# Patient Record
Sex: Male | Born: 1985 | Hispanic: No | Smoking: Never smoker
Health system: Southern US, Community
[De-identification: ages and names within clinical notes are randomized; demographics above are authoritative.]

---

## 2014-10-05 ENCOUNTER — Encounter: Payer: Self-pay | Admitting: Emergency Medicine

## 2014-10-05 ENCOUNTER — Emergency Department
Admission: EM | Admit: 2014-10-05 | Discharge: 2014-10-06 | Disposition: A | Discharge status: Home / Self Care | Payer: Medicaid - Out of State | Attending: Emergency Medicine | Admitting: Emergency Medicine

## 2014-10-05 DIAGNOSIS — R109 Unspecified abdominal pain: Secondary | ICD-10-CM | POA: Diagnosis present

## 2014-10-05 DIAGNOSIS — B349 Viral infection, unspecified: Secondary | ICD-10-CM | POA: Diagnosis not present

## 2014-10-05 MED ORDER — SODIUM CHLORIDE 0.9 % IV BOLUS (SEPSIS)
1000.0000 mL | Freq: Once | INTRAVENOUS | Status: AC
  Administered 2014-10-06: 1000 mL via INTRAVENOUS

## 2014-10-05 NOTE — ED Notes (Signed)
Pt to triage via w/c with no distress noted; pt c/o HA to top of head, chills, body aches since last night, mid abd pain

## 2014-10-06 ENCOUNTER — Emergency Department: Payer: Medicaid - Out of State

## 2014-10-06 LAB — URINALYSIS COMPLETE WITH MICROSCOPIC (ARMC ONLY)
BACTERIA UA: NONE SEEN
BILIRUBIN URINE: NEGATIVE
Glucose, UA: NEGATIVE mg/dL
HGB URINE DIPSTICK: NEGATIVE
Leukocytes, UA: NEGATIVE
Nitrite: NEGATIVE
PH: 6 (ref 5.0–8.0)
PROTEIN: NEGATIVE mg/dL
Specific Gravity, Urine: 1.021 (ref 1.005–1.030)

## 2014-10-06 LAB — COMPREHENSIVE METABOLIC PANEL
ALT: 27 U/L (ref 17–63)
AST: 24 U/L (ref 15–41)
Albumin: 4.7 g/dL (ref 3.5–5.0)
Alkaline Phosphatase: 58 U/L (ref 38–126)
Anion gap: 9 (ref 5–15)
BUN: 11 mg/dL (ref 6–20)
CO2: 27 mmol/L (ref 22–32)
CREATININE: 0.96 mg/dL (ref 0.61–1.24)
Calcium: 9.4 mg/dL (ref 8.9–10.3)
Chloride: 104 mmol/L (ref 101–111)
GFR calc non Af Amer: >60 mL/min (ref >60)
GLUCOSE: 113 mg/dL — AB (ref 65–99)
POTASSIUM: 3.7 mmol/L (ref 3.5–5.1)
Sodium: 140 mmol/L (ref 135–145)
Total Bilirubin: 0.6 mg/dL (ref 0.3–1.2)
Total Protein: 7.7 g/dL (ref 6.5–8.1)

## 2014-10-06 LAB — CBC
HCT: 45.3 % (ref 40.0–52.0)
HEMOGLOBIN: 15.2 g/dL (ref 13.0–18.0)
MCH: 29.2 pg (ref 26.0–34.0)
MCHC: 33.6 g/dL (ref 32.0–36.0)
MCV: 86.9 fL (ref 80.0–100.0)
Platelets: 152 10*3/uL (ref 150–440)
RBC: 5.21 MIL/uL (ref 4.40–5.90)
RDW: 13.7 % (ref 11.5–14.5)
WBC: 17.2 10*3/uL — ABNORMAL HIGH (ref 3.8–10.6)

## 2014-10-06 LAB — MONONUCLEOSIS SCREEN: Mono Screen: NEGATIVE

## 2014-10-06 LAB — POCT RAPID STREP A: Streptococcus, Group A Screen (Direct): NEGATIVE

## 2014-10-06 MED ORDER — ONDANSETRON HCL 4 MG/2ML IJ SOLN
4.0000 mg | Freq: Once | INTRAMUSCULAR | Status: AC
  Administered 2014-10-06: 4 mg via INTRAVENOUS
  Filled 2014-10-06: qty 2

## 2014-10-06 MED ORDER — IOHEXOL 300 MG/ML  SOLN
100.0000 mL | Freq: Once | INTRAMUSCULAR | Status: AC | PRN
  Administered 2014-10-06: 100 mL via INTRAVENOUS

## 2014-10-06 MED ORDER — MORPHINE SULFATE 2 MG/ML IJ SOLN
2.0000 mg | Freq: Once | INTRAMUSCULAR | Status: AC
  Administered 2014-10-06: 2 mg via INTRAVENOUS
  Filled 2014-10-06: qty 1

## 2014-10-06 MED ORDER — IOHEXOL 240 MG/ML SOLN
25.0000 mL | Freq: Once | INTRAMUSCULAR | Status: AC | PRN
  Administered 2014-10-06: 25 mL via ORAL

## 2014-10-06 NOTE — ED Notes (Signed)
POC Group A Strep result= NEGATIVE

## 2014-10-06 NOTE — ED Notes (Signed)
Patient transported to CT 

## 2014-10-06 NOTE — ED Notes (Signed)
Pt returned from CT °

## 2014-10-06 NOTE — Discharge Instructions (Signed)

## 2014-10-06 NOTE — ED Provider Notes (Signed)
[facility] Emergency Department Provider Note  ____________________________________________  Time seen: 12:30 AM  I have reviewed the triage vital signs and the nursing notes.   HISTORY  Chief Complaint Fever; Headache; and Abdominal Pain      HPI Nicholas Wells is a 29 y.o. male presents with generalized body aches fever chills and headache noted to the top of his head. In addition patient admits to neck stiffness. Of note patient has a history of meningitis in childhood. Patient denies any vomiting or diarrhea    Past medical history Meningitis There are no active problems to display for this patient.   Past Surgical history None No current outpatient prescriptions on file.  Allergies No known drug allergies No family history on file.  Social History History  Substance Use Topics  . Smoking status: Never Smoker   . Smokeless tobacco: Not on file  . Alcohol Use: No    Review of Systems  Constitutional: Positive for fever. Eyes: Negative for visual changes. ENT: Negative for sore throat. Cardiovascular: Negative for chest pain. Respiratory: Negative for shortness of breath. Gastrointestinal: Negative for abdominal pain, vomiting and diarrhea. Genitourinary: Negative for dysuria. Musculoskeletal: Negative for back pain. Positive for generalized muscle aches Skin: Negative for rash. Neurological: As it is for headaches. Negative for focal weakness or numbness.   10-point ROS otherwise negative.  ____________________________________________   PHYSICAL EXAM:  VITAL SIGNS: ED Triage Vitals  Enc Vitals Group     BP 10/05/14 2255 110/63 mmHg     Pulse Rate 10/05/14 2255 98     Resp 10/05/14 2255 18     Temp 10/05/14 2255 99 F (37.2 C)     Temp Source 10/05/14 2255 Oral     SpO2 10/05/14 2255 99 %     Weight 10/05/14 2255 155 lb (70.308 kg)     Height 10/05/14 2255  (1.727 m)     Head Cir --      Peak Flow --       Pain Score 10/05/14 2255 7     Pain Loc --      Pain Edu? --      Excl. in GC? --     Constitutional: Alert and oriented. Well appearing and in no distress. Eyes: Conjunctivae are normal. PERRL. Normal extraocular movements. ENT   Head: Normocephalic and atraumatic.   Nose: No congestion/rhinnorhea.   Mouth/Throat: Mucous membranes are moist.   Neck: No stridor. Hematological/Lymphatic/Immunilogical: No cervical lymphadenopathy. Cardiovascular: Normal rate, regular rhythm. Normal and symmetric distal pulses are present in all extremities. No murmurs, rubs, or gallops. Respiratory: Normal respiratory effort without tachypnea nor retractions. Breath sounds are clear and equal bilaterally. No wheezes/rales/rhonchi. Gastrointestinal: Soft and nontender. No distention. There is no CVA tenderness. Genitourinary: deferred Musculoskeletal: Nontender with normal range of motion in all extremities. No joint effusions.  No lower extremity tenderness nor edema. Neurologic:  Normal speech and language. No gross focal neurologic deficits are appreciated. Speech is normal.  Skin:  Skin is warm, dry and intact. No rash noted. Psychiatric: Mood and affect are normal. Speech and behavior are normal. Patient exhibits appropriate insight and judgment.  ____________________________________________    LABS (pertinent positives/negatives)  Labs Reviewed  CBC - Abnormal; Notable for the following:    WBC 17.2 (*)    All other components within normal limits  COMPREHENSIVE METABOLIC PANEL - Abnormal; Notable for the following:    Glucose, Bld 113 (*)    All other components within normal  limits  URINALYSIS COMPLETEWITH MICROSCOPIC ([facility] ONLY) - Abnormal; Notable for the following:    Color, Urine YELLOW (*)    APPearance CLEAR (*)    Ketones, ur TRACE (*)    Squamous Epithelial / LPF 0-5 (*)    All other components within normal limits  CULTURE, GROUP A STREP ([facility] ONLY)  CULTURE, BLOOD  (ROUTINE X 2)  CULTURE, BLOOD (ROUTINE X 2)  MONONUCLEOSIS SCREEN  POCT RAPID STREP A       RADIOLOGY  CT abdomen revealed:    CT Abdomen Pelvis W Contrast (Final result) Result time: 10/06/14 03:40:21   Final result by Rad Results In Interface (10/06/14 03:40:21)   Narrative:   CLINICAL DATA: Acute onset of chills, body aches and mid abdominal pain. Headache. Leukocytosis. Initial encounter.  EXAM: CT ABDOMEN AND PELVIS WITH CONTRAST  TECHNIQUE: Multidetector CT imaging of the abdomen and pelvis was performed using the standard protocol following bolus administration of intravenous contrast.  CONTRAST: OMNIPAQUE IOHEXOL 300 MG/ML SOLN  COMPARISON: None.  FINDINGS: The visualized lung bases are clear.  A 1.1 cm hypodensity at the medial right hepatic lobe is nonspecific. The liver and spleen are otherwise unremarkable. The gallbladder is within normal limits. The pancreas and adrenal glands are unremarkable.  The kidneys are unremarkable in appearance. There is no evidence of hydronephrosis. No renal or ureteral stones are seen. No perinephric stranding is appreciated.  No free fluid is identified. The small bowel is unremarkable in appearance. The stomach is within normal limits. No acute vascular abnormalities are seen. A circumaortic left renal vein is incidentally noted.  The patient is status post appendectomy, with associated postoperative change at the right lower quadrant. The colon is unremarkable in appearance.  The bladder is mildly distended and grossly unremarkable. The prostate remains normal in size. No inguinal lymphadenopathy is seen.  No acute osseous abnormalities are identified.  IMPRESSION: 1. No acute abnormality seen within the abdomen or pelvis. 2. Nonspecific 1.1 cm hypodensity at the medial right hepatic lobe. This is likely benign; would correlate with LFTs. 3. Circumaortic left renal vein incidentally noted.         INITIAL IMPRESSION / ASSESSMENT AND PLAN / ED COURSE  Pertinent labs & imaging results that were available during my care of the patient were reviewed by me and considered in my medical decision making (see chart for details).  Patient received IV morphine and Zofran and normal saline in emergency department with resolution of symptoms. Consider meningitis as potential etiology for the patient's pain however given absence of neck stiffness on exam no fever noted in the emergency department this diagnosis was considered unlikely area  ____________________________________________   FINAL CLINICAL IMPRESSION(S) / ED DIAGNOSES  Final diagnoses:  Viral syndrome      Darci Current, MD 10/20/14 [phone]

## 2014-10-08 LAB — CULTURE, GROUP A STREP (THRC)

## 2014-10-11 LAB — CULTURE, BLOOD (ROUTINE X 2)
CULTURE: NO GROWTH
CULTURE: NO GROWTH

## 2017-02-18 IMAGING — CT CT ABD-PELV W/ CM
2 of 4 series · 17 of 46 positions shown, 19 images · IV contrast (omnipaque)
Comparison: None.

CLINICAL DATA: Acute onset of chills, body aches and mid abdominal
pain. Headache. Leukocytosis. Initial encounter.

EXAM:
CT ABDOMEN AND PELVIS WITH CONTRAST
TECHNIQUE: Multidetector CT imaging of the abdomen and pelvis was performed
using the standard protocol following bolus administration of
intravenous contrast.
CONTRAST:  100mL OMNIPAQUE IOHEXOL 300 MG/ML  SOLN

[Series 2: routine abd pel with · axial · 0.61mm/px · z∈[-436,-51]mm · 14 of 85 slices shown, 16 images]
[im 4/85  soft-tissue]
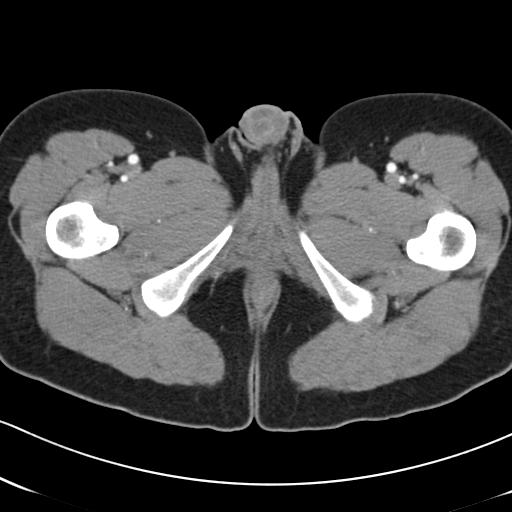
[im 4/85  bone]
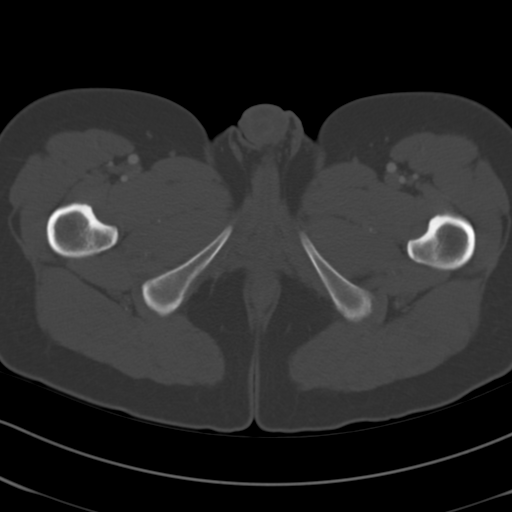
[im 11/85  soft-tissue]
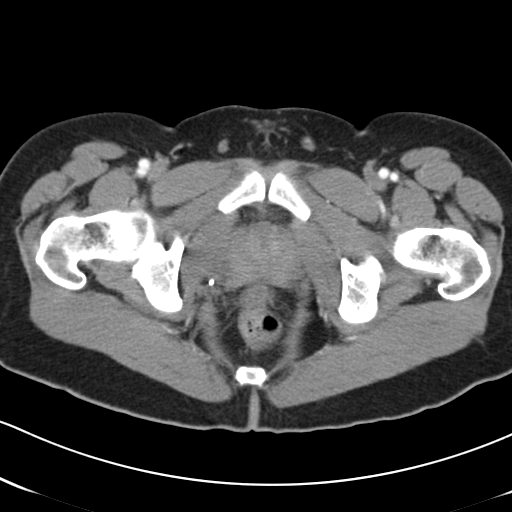
[im 17/85  soft-tissue]
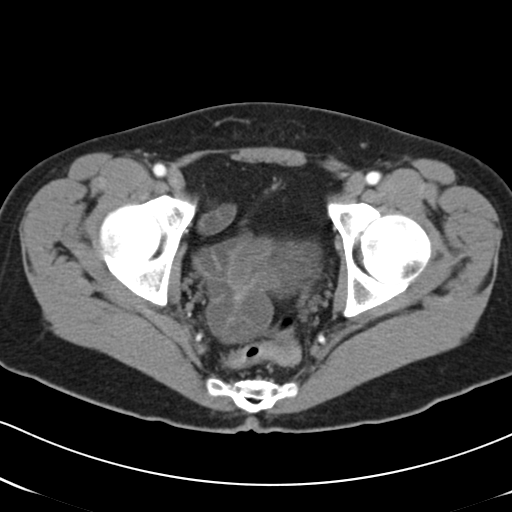
[im 24/85  soft-tissue]
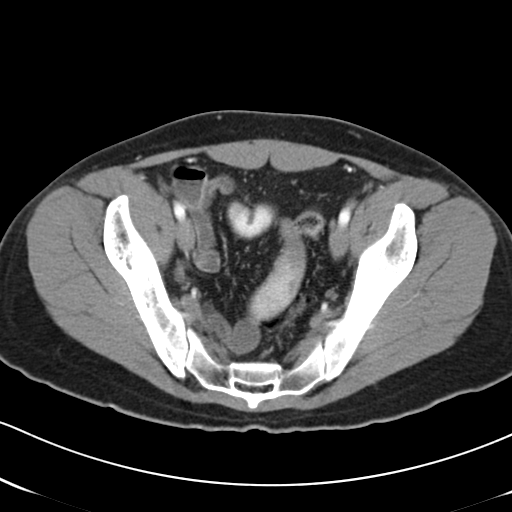
[im 27/85  soft-tissue]
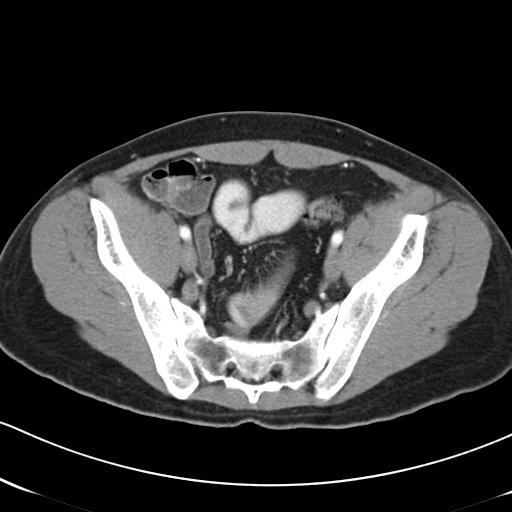
[im 34/85  soft-tissue]
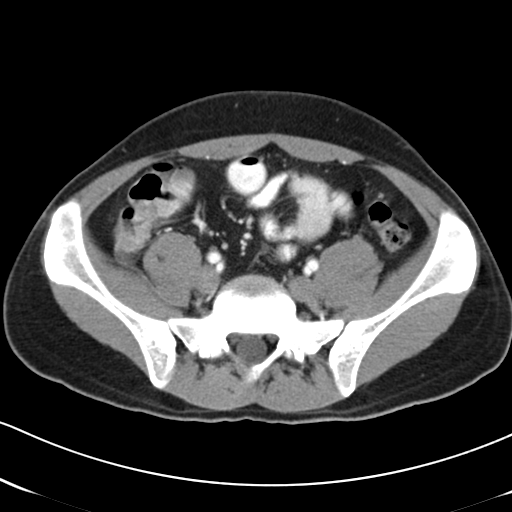
[im 41/85  soft-tissue]
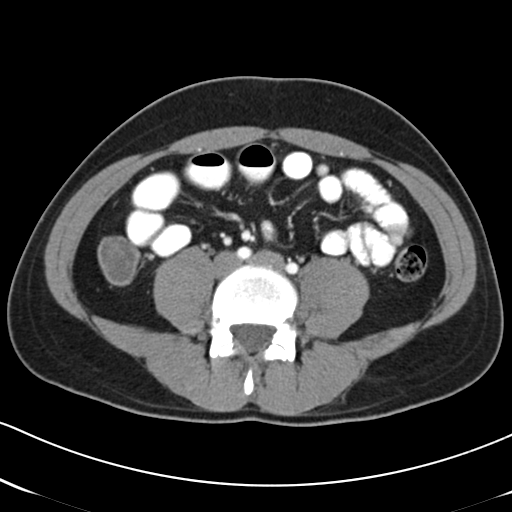
[im 44/85  soft-tissue]
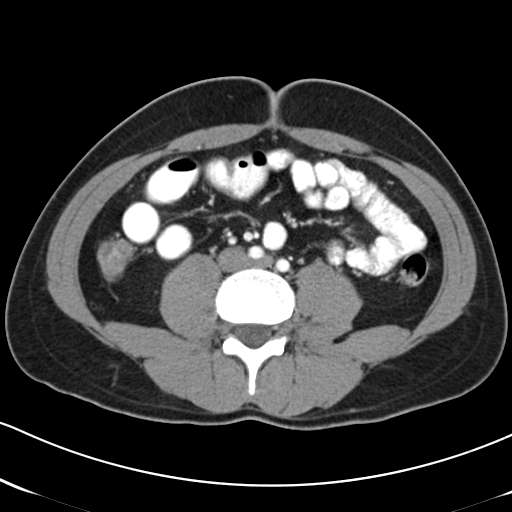
[im 51/85  soft-tissue]
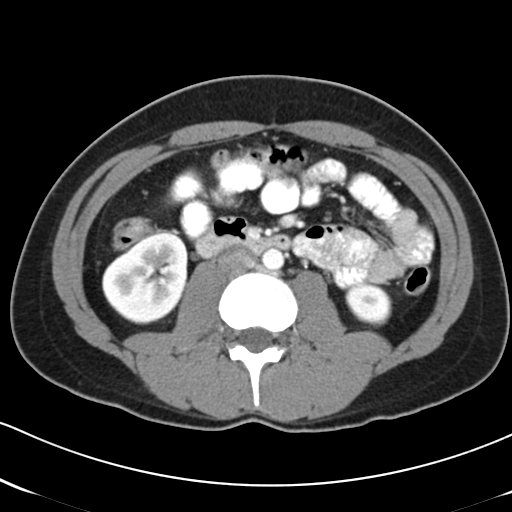
[im 51/85  bone]
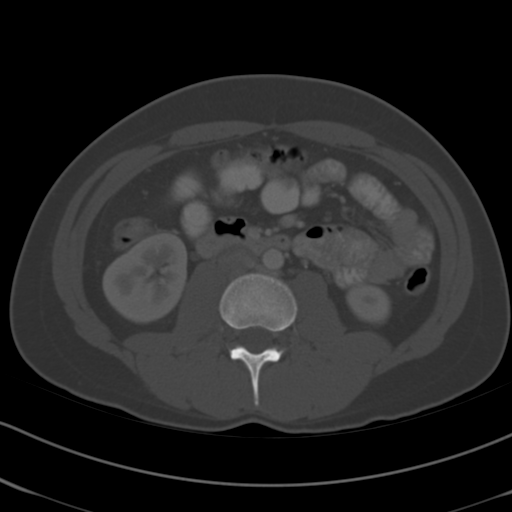
[im 58/85  soft-tissue]
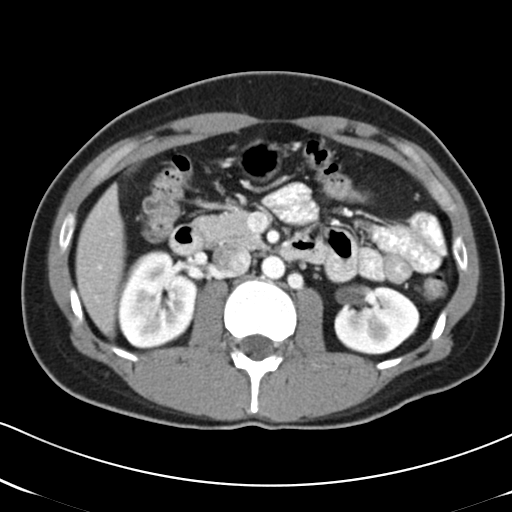
[im 64/85  soft-tissue]
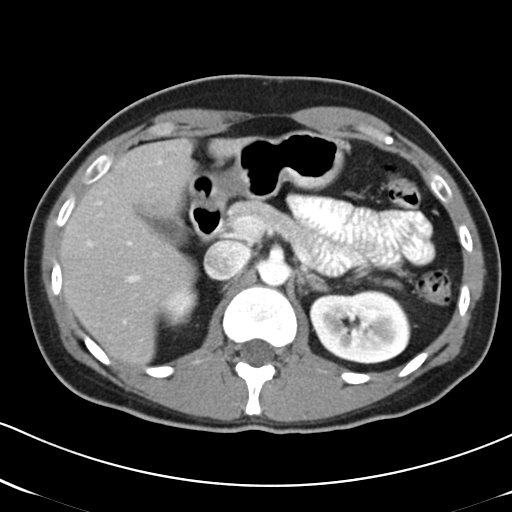
[im 68/85  soft-tissue]
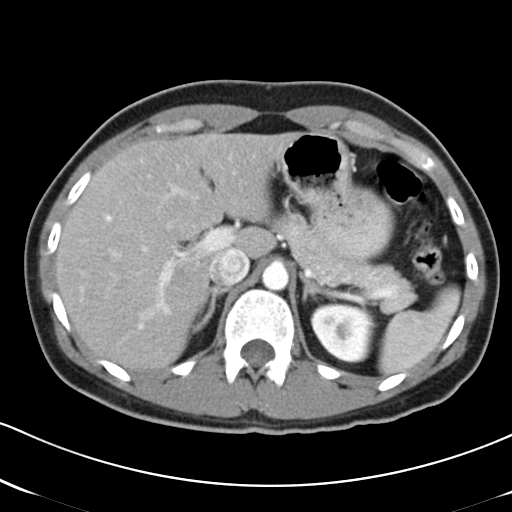
[im 74/85  soft-tissue]
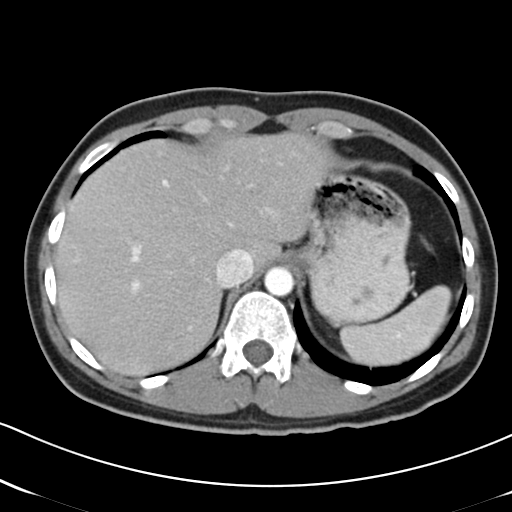
[im 81/85  soft-tissue]
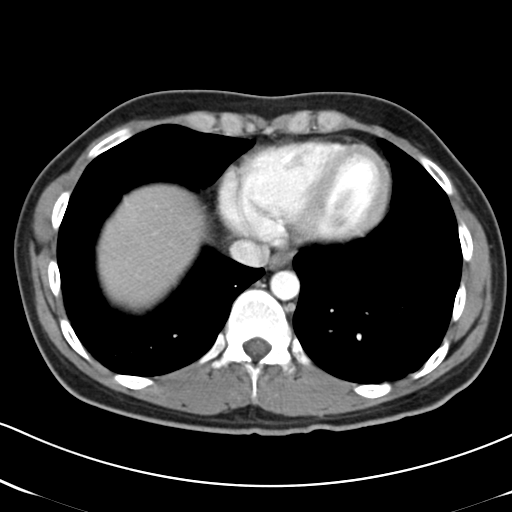

[Series 5: cor routine abd pel with · coronal · 0.89mm/px · 3 of 113 slices shown]
[im 38/113  soft-tissue]
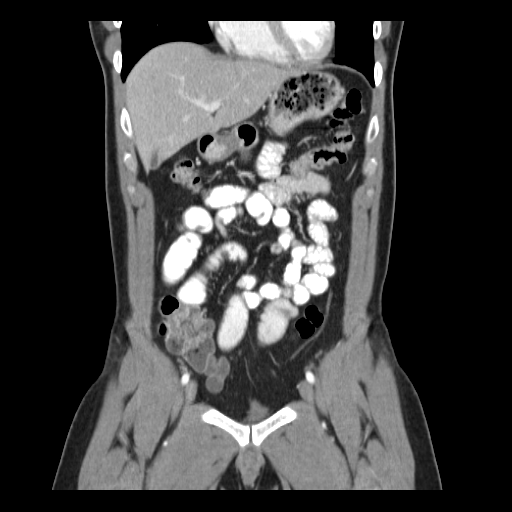
[im 50/113  soft-tissue]
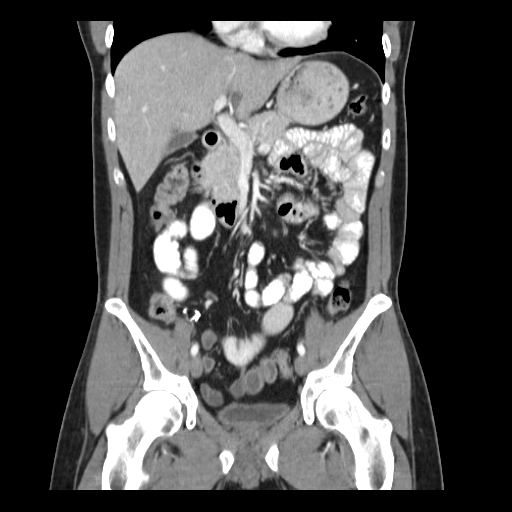
[im 63/113  soft-tissue]
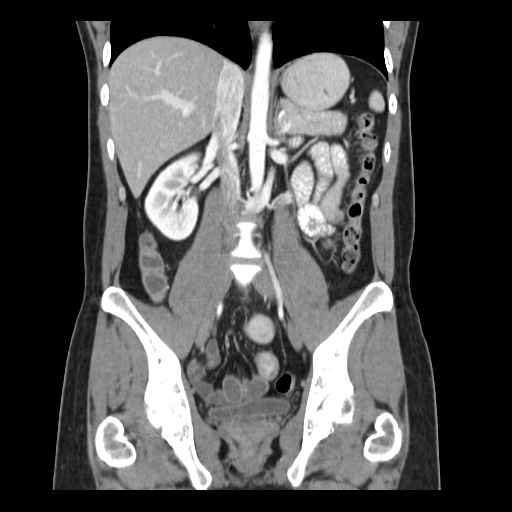

[17 of 46 positions shown; findings below may reference images not displayed]

FINDINGS: The visualized lung bases are clear.

A 1.1 cm hypodensity at the medial right hepatic lobe is
nonspecific. The liver and spleen are otherwise unremarkable. The
gallbladder is within normal limits. The pancreas and adrenal glands
are unremarkable.

The kidneys are unremarkable in appearance. There is no evidence of
hydronephrosis. No renal or ureteral stones are seen. No perinephric
stranding is appreciated.

No free fluid is identified. The small bowel is unremarkable in
appearance. The stomach is within normal limits. No acute vascular
abnormalities are seen. A circumaortic left renal vein is
incidentally noted.

The patient is status post appendectomy, with associated
postoperative change at the right lower quadrant. The colon is
unremarkable in appearance.

The bladder is mildly distended and grossly unremarkable. The
prostate remains normal in size. No inguinal lymphadenopathy is
seen.

No acute osseous abnormalities are identified.
IMPRESSION: 1. No acute abnormality seen within the abdomen or pelvis.
2. Nonspecific 1.1 cm hypodensity at the medial right hepatic lobe.
This is likely benign; would correlate with LFTs.
3. Circumaortic left renal vein incidentally noted.
